# Patient Record
Sex: Male | Born: 1965 | Race: White | Hispanic: No | Marital: Married | Smoking: Never smoker
Health system: Southern US, Community
[De-identification: ages and names within clinical notes are randomized; demographics above are authoritative.]

## PROBLEM LIST (undated history)

## (undated) DIAGNOSIS — Z973 Presence of spectacles and contact lenses: Secondary | ICD-10-CM

## (undated) DIAGNOSIS — Z789 Other specified health status: Secondary | ICD-10-CM

## (undated) DIAGNOSIS — T7840XA Allergy, unspecified, initial encounter: Secondary | ICD-10-CM

## (undated) HISTORY — PX: HERNIA REPAIR: SHX51

## (undated) HISTORY — DX: Allergy, unspecified, initial encounter: T78.40XA

## (undated) HISTORY — PX: WISDOM TOOTH EXTRACTION: SHX21

---

## 2002-07-20 ENCOUNTER — Emergency Department (HOSPITAL_COMMUNITY): Admission: EM | Admit: 2002-07-20 | Discharge: 2002-07-21 | Payer: Self-pay | Admitting: Emergency Medicine

## 2002-07-21 ENCOUNTER — Encounter: Payer: Self-pay | Admitting: Emergency Medicine

## 2010-08-18 HISTORY — PX: VASECTOMY: SHX75

## 2011-02-01 ENCOUNTER — Emergency Department (HOSPITAL_BASED_OUTPATIENT_CLINIC_OR_DEPARTMENT_OTHER)
Admission: EM | Admit: 2011-02-01 | Discharge: 2011-02-01 | Disposition: A | Discharge status: Home / Self Care | Payer: No Typology Code available for payment source | Attending: Emergency Medicine | Admitting: Emergency Medicine

## 2011-02-01 DIAGNOSIS — M25519 Pain in unspecified shoulder: Secondary | ICD-10-CM | POA: Insufficient documentation

## 2011-02-01 DIAGNOSIS — Y92410 Unspecified street and highway as the place of occurrence of the external cause: Secondary | ICD-10-CM | POA: Insufficient documentation

## 2012-06-01 ENCOUNTER — Encounter (INDEPENDENT_AMBULATORY_CARE_PROVIDER_SITE_OTHER): Payer: Self-pay

## 2012-06-15 ENCOUNTER — Other Ambulatory Visit (INDEPENDENT_AMBULATORY_CARE_PROVIDER_SITE_OTHER): Payer: Self-pay | Admitting: Surgery

## 2012-06-15 ENCOUNTER — Ambulatory Visit (INDEPENDENT_AMBULATORY_CARE_PROVIDER_SITE_OTHER): Payer: BC Managed Care – PPO | Admitting: Surgery

## 2012-06-15 ENCOUNTER — Encounter (INDEPENDENT_AMBULATORY_CARE_PROVIDER_SITE_OTHER): Payer: Self-pay | Admitting: Surgery

## 2012-06-15 VITALS — BP 126/82 | HR 95 | Temp 97.8°F | Ht 70.5 in | Wt 226.8 lb

## 2012-06-15 DIAGNOSIS — K429 Umbilical hernia without obstruction or gangrene: Secondary | ICD-10-CM

## 2012-06-15 NOTE — Patient Instructions (Signed)
Hernia A hernia occurs when an internal organ pushes out through a weak spot in the abdominal wall. Hernias most commonly occur in the groin and around the navel. Hernias often can be pushed back into place (reduced). Most hernias tend to get worse over time. Some abdominal hernias can get stuck in the opening (irreducible or incarcerated hernia) and cannot be reduced. An irreducible abdominal hernia which is tightly squeezed into the opening is at risk for impaired blood supply (strangulated hernia). A strangulated hernia is a medical emergency. Because of the risk for an irreducible or strangulated hernia, surgery may be recommended to repair a hernia. CAUSES   Heavy lifting.  Prolonged coughing.  Straining to have a bowel movement.  A cut (incision) made during an abdominal surgery. HOME CARE INSTRUCTIONS   Bed rest is not required. You may continue your normal activities.  Avoid lifting more than 10 pounds (4.5 kg) or straining.  Cough gently. If you are a smoker it is best to stop. Even the best hernia repair can break down with the continual strain of coughing. Even if you do not have your hernia repaired, a cough will continue to aggravate the problem.  Do not wear anything tight over your hernia. Do not try to keep it in with an outside bandage or truss. These can damage abdominal contents if they are trapped within the hernia sac.  Eat a normal diet.  Avoid constipation. Straining over long periods of time will increase hernia size and encourage breakdown of repairs. If you cannot do this with diet alone, stool softeners may be used. SEEK IMMEDIATE MEDICAL CARE IF:   You have a fever.  You develop increasing abdominal pain.  You feel nauseous or vomit.  Your hernia is stuck outside the abdomen, looks discolored, feels hard, or is tender.  You have any changes in your bowel habits or in the hernia that are unusual for you.  You have increased pain or swelling around the  hernia.  You cannot push the hernia back in place by applying gentle pressure while lying down. MAKE SURE YOU:   Understand these instructions.  Will watch your condition.  Will get help right away if you are not doing well or get worse. Document Released: 08/04/2005 Document Revised: 10/27/2011 Document Reviewed: 03/23/2008 [facility] Patient Information 2013 [place], [place].   Hernia, Surgical Repair Care After Refer to this sheet in the next few weeks. These discharge instructions provide you with general information on caring for yourself after you leave the hospital. Your caregiver may also give you specific instructions. Your treatment has been planned according to the most current medical practices available, but unavoidable complications sometimes occur. If you have any problems or questions after discharge, please call your caregiver. HOME CARE INSTRUCTIONS   It is normal to be sore for a couple weeks after surgery. See your caregiver if this seems to be getting worse rather than better.  Put ice on the operative site.  Put ice in a plastic bag.  Place a towel between your skin and the bag.  Leave the ice on for 15 to 20 minutes at a time, 3 to 4 times a day for the first 2 days.  Change bandages (dressings) as directed.  Keep the wound dry and clean. The wound may be washed gently with soap and water. Gently blot or dab the wound dry. Do not take baths, use swimming pools, or use hot tubs for 10 days, or as directed by your caregiver.  Only take over-the-counter or prescription medicines for pain, discomfort, or fever as directed by your caregiver.  Continue your normal diet as directed.  Do not drive until your caregiver says it is okay.  Do not lift anything more than 10 pounds or play contact sports for 3 weeks, or as directed.  Make an appointment to see your caregiver for stitches (sutures) or staple removal when instructed. SEEK MEDICAL CARE IF:   You have  increased bleeding coming from the wounds.  You have blood in your stool.  You see redness, swelling, or have increasing pain in the wounds.  You have fluid (pus) coming from the wound.  You have an oral temperature above 102 F (38.9 C).  You notice a bad smell coming from the wound or dressing.  You develop lightheadedness or feel faint. SEEK IMMEDIATE MEDICAL CARE IF:   You develop a rash.  You have difficulty breathing.  You develop any reaction or side effects to medicines given. MAKE SURE YOU:   Understand these instructions.  Will watch your condition.  Will get help right away if you are not doing well or get worse. Document Released: 02/21/2005 Document Revised: 10/27/2011 Document Reviewed: 07/04/2009 [facility] Patient Information 2013 [place], [place].

## 2012-06-15 NOTE — Progress Notes (Signed)
Patient ID: Erik Lloyd, male   DOB: [birth year], 46 y.o.   MRN: [id number]  Chief Complaint  Patient presents with  . Umbilical Hernia    pre op    HPI Erik Lloyd is a 46 y.o. male.   HPI patient sent at the request of Dr. Penni Bombard do to bulging umbilicus. This has been present for 6 months. Is causing mild discomfort especially when coughing, laughing or sneezing. No nausea or vomiting.  No sharp stabbing pain.  History reviewed. No pertinent past medical history.  Past Surgical History  Procedure Date  . Vasectomy 2012    Family History  Problem Relation Age of Onset  . Hypertension Mother   . Thyroid disease Mother   . Hypertension Father     Social History History  Substance Use Topics  . Smoking status: Never Smoker   . Smokeless tobacco: Not on file  . Alcohol Use: No    No Known Allergies  No current outpatient prescriptions on file.    Review of Systems Review of Systems  Constitutional: Negative for fever, chills and unexpected weight change.  HENT: Negative for hearing loss, congestion, sore throat, trouble swallowing and voice change.   Eyes: Negative for visual disturbance.  Respiratory: Negative for cough and wheezing.   Cardiovascular: Negative for chest pain, palpitations and leg swelling.  Gastrointestinal: Negative for nausea, vomiting, abdominal pain, diarrhea, constipation, blood in stool, abdominal distention, anal bleeding and rectal pain.  Genitourinary: Negative for hematuria and difficulty urinating.  Musculoskeletal: Negative for arthralgias.  Skin: Negative for rash and wound.  Neurological: Negative for seizures, syncope, weakness and headaches.  Hematological: Negative for adenopathy. Does not bruise/bleed easily.  Psychiatric/Behavioral: Negative for confusion.    Blood pressure 126/82, pulse 95, temperature 97.8 F (36.6 C), temperature source Temporal, height 5' 10.5" (1.791 m), weight 226 lb 12.8 oz (102.876 kg), SpO2  96.00%.  Physical Exam Physical Exam  Constitutional: He appears well-developed and well-nourished.  HENT:  Head: Normocephalic and atraumatic.  Eyes: EOM are normal. Pupils are equal, round, and reactive to light.  Neck: Normal range of motion. Neck supple.  Cardiovascular: Normal rate and regular rhythm.   Pulmonary/Chest: Effort normal and breath sounds normal.  Abdominal: Soft. Normal appearance. He exhibits no distension. A hernia is present.    Musculoskeletal: Normal range of motion.  Neurological: He is alert.  Skin: Skin is warm and dry.  Psychiatric: He has a normal mood and affect. His behavior is normal. Judgment normal.      Assessment    Umbilical hernia symptomatic    Plan    Repair umbilical hernia. Patient wishes to have his hernia repaired since it is getting larger becoming more uncomfortable.  The risk of hernia repair include bleeding,  Infection,   Recurrence of the hernia,  Mesh use, chronic pain,  Organ injury,  Bowel injury,  Bladder injury,   nerve injury with numbness around the incision,  Death,  and worsening of preexisting  medical problems.  The alternatives to surgery have been discussed as well..  Long term expectations of both operative and non operative treatments have been discussed.   The patient agrees to proceed.       Erik Lloyd A. 06/15/2012, 10:08 AM

## 2012-09-08 ENCOUNTER — Encounter (HOSPITAL_BASED_OUTPATIENT_CLINIC_OR_DEPARTMENT_OTHER): Payer: Self-pay | Admitting: *Deleted

## 2012-09-08 NOTE — Progress Notes (Signed)
No med Never smoked occ snores, no sleep apnea To come in for [facility] labs-

## 2012-09-10 ENCOUNTER — Encounter (HOSPITAL_BASED_OUTPATIENT_CLINIC_OR_DEPARTMENT_OTHER): Payer: BC Managed Care – PPO

## 2012-09-10 LAB — CBC WITH DIFFERENTIAL/PLATELET
Basophils Relative: 1 % (ref 0–1)
Eosinophils Absolute: 0.2 10*3/uL (ref 0.0–0.7)
Eosinophils Relative: 2 % (ref 0–5)
Lymphs Abs: 2.1 10*3/uL (ref 0.7–4.0)
MCH: 30.8 pg (ref 26.0–34.0)
MCHC: 35.6 g/dL (ref 30.0–36.0)
MCV: 86.7 fL (ref 78.0–100.0)
Platelets: 294 10*3/uL (ref 150–400)
RBC: 5.48 MIL/uL (ref 4.22–5.81)
RDW: 13.3 % (ref 11.5–15.5)

## 2012-09-10 LAB — COMPREHENSIVE METABOLIC PANEL
ALT: 32 U/L (ref 0–53)
Albumin: 4 g/dL (ref 3.5–5.2)
BUN: 14 mg/dL (ref 6–23)
Calcium: 9.1 mg/dL (ref 8.4–10.5)
GFR calc Af Amer: >90 mL/min (ref >90)
Glucose, Bld: 103 mg/dL — ABNORMAL HIGH (ref 70–99)
Sodium: 138 mEq/L (ref 135–145)
Total Protein: 7 g/dL (ref 6.0–8.3)

## 2012-09-14 ENCOUNTER — Ambulatory Visit (HOSPITAL_BASED_OUTPATIENT_CLINIC_OR_DEPARTMENT_OTHER): Payer: BC Managed Care – PPO | Admitting: *Deleted

## 2012-09-14 ENCOUNTER — Encounter (HOSPITAL_BASED_OUTPATIENT_CLINIC_OR_DEPARTMENT_OTHER): Payer: Self-pay | Admitting: *Deleted

## 2012-09-14 ENCOUNTER — Ambulatory Visit (HOSPITAL_BASED_OUTPATIENT_CLINIC_OR_DEPARTMENT_OTHER)
Admission: RE | Admit: 2012-09-14 | Discharge: 2012-09-14 | Disposition: A | Discharge status: Home / Self Care | Payer: BC Managed Care – PPO | Source: Ambulatory Visit | Attending: Surgery | Admitting: Surgery

## 2012-09-14 ENCOUNTER — Encounter (HOSPITAL_BASED_OUTPATIENT_CLINIC_OR_DEPARTMENT_OTHER): Payer: Self-pay

## 2012-09-14 ENCOUNTER — Encounter (HOSPITAL_BASED_OUTPATIENT_CLINIC_OR_DEPARTMENT_OTHER)
Admission: RE | Disposition: A | Discharge status: Home / Self Care | Payer: Self-pay | Source: Ambulatory Visit | Attending: Surgery

## 2012-09-14 DIAGNOSIS — K429 Umbilical hernia without obstruction or gangrene: Secondary | ICD-10-CM

## 2012-09-14 HISTORY — PX: INSERTION OF MESH: SHX5868

## 2012-09-14 HISTORY — PX: UMBILICAL HERNIA REPAIR: SHX196

## 2012-09-14 HISTORY — DX: Other specified health status: Z78.9

## 2012-09-14 HISTORY — DX: Presence of spectacles and contact lenses: Z97.3

## 2012-09-14 LAB — POCT HEMOGLOBIN-HEMACUE: Hemoglobin: 16.7 g/dL (ref 13.0–17.0)

## 2012-09-14 SURGERY — REPAIR, HERNIA, UMBILICAL, ADULT
Anesthesia: General | Site: Abdomen | Wound class: Clean

## 2012-09-14 MED ORDER — OXYCODONE-ACETAMINOPHEN 5-325 MG PO TABS
1.0000 | ORAL_TABLET | ORAL | Status: DC | PRN
Start: 2012-09-14 — End: 2014-11-13

## 2012-09-14 MED ORDER — OXYCODONE HCL 5 MG/5ML PO SOLN
5.0000 mg | Freq: Once | ORAL | Status: AC | PRN
Start: 2012-09-14 — End: 2012-09-14

## 2012-09-14 MED ORDER — BUPIVACAINE-EPINEPHRINE 0.25% -1:200000 IJ SOLN
INTRAMUSCULAR | Status: DC | PRN
  Administered 2012-09-14: 20 mL

## 2012-09-14 MED ORDER — CEFAZOLIN SODIUM-DEXTROSE 2-3 GM-% IV SOLR
INTRAVENOUS | Status: DC | PRN
  Administered 2012-09-14: 2 g via INTRAVENOUS

## 2012-09-14 MED ORDER — FENTANYL CITRATE 0.05 MG/ML IJ SOLN
INTRAMUSCULAR | Status: DC | PRN
  Administered 2012-09-14 (×2): 50 ug via INTRAVENOUS

## 2012-09-14 MED ORDER — OXYCODONE HCL 5 MG PO TABS
5.0000 mg | ORAL_TABLET | Freq: Once | ORAL | Status: AC | PRN
  Administered 2012-09-14: 5 mg via ORAL

## 2012-09-14 MED ORDER — MIDAZOLAM HCL 5 MG/5ML IJ SOLN
INTRAMUSCULAR | Status: DC | PRN
  Administered 2012-09-14: 2 mg via INTRAVENOUS

## 2012-09-14 MED ORDER — 0.9 % SODIUM CHLORIDE (POUR BTL) OPTIME
TOPICAL | Status: DC | PRN
  Administered 2012-09-14: 100 mL

## 2012-09-14 MED ORDER — DEXAMETHASONE SODIUM PHOSPHATE 4 MG/ML IJ SOLN
INTRAMUSCULAR | Status: DC | PRN
  Administered 2012-09-14: 10 mg via INTRAVENOUS

## 2012-09-14 MED ORDER — ONDANSETRON HCL 4 MG/2ML IJ SOLN
INTRAMUSCULAR | Status: DC | PRN
  Administered 2012-09-14: 4 mg via INTRAVENOUS

## 2012-09-14 MED ORDER — PROPOFOL 10 MG/ML IV BOLUS
INTRAVENOUS | Status: DC | PRN
  Administered 2012-09-14: 250 mg via INTRAVENOUS

## 2012-09-14 MED ORDER — FENTANYL CITRATE 0.05 MG/ML IJ SOLN: 50.0000 ug | INTRAMUSCULAR | Status: DC | PRN

## 2012-09-14 MED ORDER — LACTATED RINGERS IV SOLN
INTRAVENOUS | Status: DC
  Administered 2012-09-14 (×2): via INTRAVENOUS

## 2012-09-14 MED ORDER — HYDROMORPHONE HCL PF 1 MG/ML IJ SOLN
0.2500 mg | INTRAMUSCULAR | Status: DC | PRN
  Administered 2012-09-14 (×3): 0.5 mg via INTRAVENOUS

## 2012-09-14 MED ORDER — ONDANSETRON HCL 4 MG/2ML IJ SOLN: 4.0000 mg | Freq: Once | INTRAMUSCULAR | Status: DC | PRN

## 2012-09-14 MED ORDER — TRAMADOL HCL 50 MG PO TABS: 50.0000 mg | ORAL_TABLET | Freq: Four times a day (QID) | ORAL | Status: DC | PRN

## 2012-09-14 MED ORDER — LIDOCAINE HCL (CARDIAC) 20 MG/ML IV SOLN
INTRAVENOUS | Status: DC | PRN
  Administered 2012-09-14: 60 mg via INTRAVENOUS

## 2012-09-14 MED ORDER — ACETAMINOPHEN 10 MG/ML IV SOLN
1000.0000 mg | Freq: Once | INTRAVENOUS | Status: AC
  Administered 2012-09-14: 1000 mg via INTRAVENOUS

## 2012-09-14 MED ORDER — MIDAZOLAM HCL 2 MG/2ML IJ SOLN: 1.0000 mg | INTRAMUSCULAR | Status: DC | PRN

## 2012-09-14 SURGICAL SUPPLY — 54 items
BENZOIN TINCTURE PRP APPL 2/3 (GAUZE/BANDAGES/DRESSINGS) IMPLANT
BLADE SURG 10 STRL SS (BLADE) ×2 IMPLANT
BLADE SURG 15 STRL LF DISP TIS (BLADE) ×1 IMPLANT
BLADE SURG 15 STRL SS (BLADE) ×1
BLADE SURG ROTATE 9660 (MISCELLANEOUS) IMPLANT
CANISTER SUCTION 1200CC (MISCELLANEOUS) ×2 IMPLANT
CHLORAPREP W/TINT 26ML (MISCELLANEOUS) ×2 IMPLANT
CLEANER CAUTERY TIP 5X5 PAD (MISCELLANEOUS) ×1 IMPLANT
CLOTH BEACON ORANGE TIMEOUT ST (SAFETY) ×2 IMPLANT
COVER MAYO STAND STRL (DRAPES) ×2 IMPLANT
COVER TABLE BACK 60X90 (DRAPES) ×2 IMPLANT
DECANTER SPIKE VIAL GLASS SM (MISCELLANEOUS) IMPLANT
DERMABOND ADVANCED (GAUZE/BANDAGES/DRESSINGS) ×2
DERMABOND ADVANCED .7 DNX12 (GAUZE/BANDAGES/DRESSINGS) ×2 IMPLANT
DRAPE LAPAROTOMY TRNSV 102X78 (DRAPE) ×2 IMPLANT
DRAPE UTILITY XL STRL (DRAPES) ×2 IMPLANT
DRSG TEGADERM 4X4.75 (GAUZE/BANDAGES/DRESSINGS) IMPLANT
ELECT REM PT RETURN 9FT ADLT (ELECTROSURGICAL) ×2
ELECTRODE REM PT RTRN 9FT ADLT (ELECTROSURGICAL) ×1 IMPLANT
GLOVE BIO SURGEON STRL SZ 6.5 (GLOVE) ×2 IMPLANT
GLOVE BIOGEL PI IND STRL 7.0 (GLOVE) ×1 IMPLANT
GLOVE BIOGEL PI IND STRL 8 (GLOVE) ×1 IMPLANT
GLOVE BIOGEL PI INDICATOR 7.0 (GLOVE) ×1
GLOVE BIOGEL PI INDICATOR 8 (GLOVE) ×1
GLOVE ECLIPSE 8.0 STRL XLNG CF (GLOVE) ×2 IMPLANT
GLOVE SKINSENSE NS SZ6.5 (GLOVE) ×1
GLOVE SKINSENSE STRL SZ6.5 (GLOVE) ×1 IMPLANT
GOWN PREVENTION PLUS XLARGE (GOWN DISPOSABLE) ×6 IMPLANT
NEEDLE HYPO 22GX1.5 SAFETY (NEEDLE) IMPLANT
NEEDLE HYPO 25X1 1.5 SAFETY (NEEDLE) ×2 IMPLANT
NS IRRIG 1000ML POUR BTL (IV SOLUTION) IMPLANT
PACK BASIN DAY SURGERY FS (CUSTOM PROCEDURE TRAY) ×2 IMPLANT
PAD CLEANER CAUTERY TIP 5X5 (MISCELLANEOUS) ×1
PATCH VENTRAL MEDIUM 6.4 (Mesh Specialty) ×2 IMPLANT
PENCIL BUTTON HOLSTER BLD 10FT (ELECTRODE) ×2 IMPLANT
SLEEVE SCD COMPRESS KNEE MED (MISCELLANEOUS) ×2 IMPLANT
STAPLER VISISTAT 35W (STAPLE) IMPLANT
STRIP CLOSURE SKIN 1/2X4 (GAUZE/BANDAGES/DRESSINGS) IMPLANT
SUT MNCRL AB 3-0 PS2 18 (SUTURE) ×2 IMPLANT
SUT MON AB 4-0 PC3 18 (SUTURE) ×2 IMPLANT
SUT NOVA NAB DX-16 0-1 5-0 T12 (SUTURE) ×2 IMPLANT
SUT NOVA NAB GS-22 2 0 T19 (SUTURE) IMPLANT
SUT PROLENE 0 CT 1 30 (SUTURE) IMPLANT
SUT SILK 3 0 SH 30 (SUTURE) IMPLANT
SUT VIC AB 2-0 SH 27 (SUTURE) ×3
SUT VIC AB 2-0 SH 27XBRD (SUTURE) ×3 IMPLANT
SUT VIC AB 3-0 SH 27 (SUTURE)
SUT VIC AB 3-0 SH 27X BRD (SUTURE) IMPLANT
SYR CONTROL 10ML LL (SYRINGE) ×2 IMPLANT
TOWEL OR 17X24 6PK STRL BLUE (TOWEL DISPOSABLE) ×2 IMPLANT
TOWEL OR NON WOVEN STRL DISP B (DISPOSABLE) ×2 IMPLANT
TUBE CONNECTING 20X1/4 (TUBING) ×2 IMPLANT
WATER STERILE IRR 1000ML POUR (IV SOLUTION) ×2 IMPLANT
YANKAUER SUCT BULB TIP NO VENT (SUCTIONS) ×2 IMPLANT

## 2012-09-14 NOTE — Transfer of Care (Signed)
Immediate Anesthesia Transfer of Care Note  Patient: Erik Lloyd  Procedure(s) Performed: Procedure(s) (LRB) with comments: HERNIA REPAIR UMBILICAL ADULT (N/A) - umbilical hernia repair with mesh INSERTION OF MESH (N/A)  Patient Location: PACU  Anesthesia Type:General  Level of Consciousness: awake and confused  Airway & Oxygen Therapy: Patient Spontanous Breathing and Patient connected to face mask oxygen  Post-op Assessment: Report given to PACU RN, Post -op Vital signs reviewed and stable and Patient moving all extremities  Post vital signs: Reviewed and stable  Complications: No apparent anesthesia complications

## 2012-09-14 NOTE — Anesthesia Postprocedure Evaluation (Signed)
  Anesthesia Post-op Note  Patient: Erik Lloyd  Procedure(s) Performed: Procedure(s) (LRB) with comments: HERNIA REPAIR UMBILICAL ADULT (N/A) - umbilical hernia repair with mesh INSERTION OF MESH (N/A)  Patient Location: PACU  Anesthesia Type:General  Level of Consciousness: awake, alert  and oriented  Airway and Oxygen Therapy: Patient Spontanous Breathing and Patient connected to face mask oxygen  Post-op Pain: mild  Post-op Assessment: Post-op Vital signs reviewed  Post-op Vital Signs: Reviewed  Complications: No apparent anesthesia complications

## 2012-09-14 NOTE — Op Note (Signed)
NAME:  Erik Lloyd, Erik Lloyd NO.:  [id number]  MEDICAL RECORD NO.:  [id number]  LOCATION:                                 FACILITY:  PHYSICIAN:  Maisie Fus A. Adrean Heitz, M.D.DATE OF BIRTH:  09-Dec-1965  DATE OF PROCEDURE:  09/14/2012 DATE OF DISCHARGE:                              OPERATIVE REPORT   PREOPERATIVE DIAGNOSIS:  Umbilical hernia, measuring 2 x 2 cm.  POSTOPERATIVE DIAGNOSIS:  Umbilical hernia, measuring 2 x 2 cm.  PROCEDURE:  Repair of umbilical hernia with 6.4 cm x 6.4 cm Proceed umbilical patch mesh.  SURGEON:  Maisie Fus A. Arianna Delsanto, MD  ANESTHESIA:  LMA with 0.25% Sensorcaine local.  EBL:  Minimal.  SPECIMENS:  None.  DRAINS:  None.  INDICATIONS FOR PROCEDURE:  The patient has a symptomatic umbilical hernia.  He wishes repair due to pain and discomfort and enlarging size. Risks, benefits, and alternative therapies discussed.  Risk of bleeding, infection, small-bowel obstruction, mesh infection, mesh erosion in the bowel, mesh disruption, need for further operations, discussed. Alternatives discussed.  He wished to proceed.  DESCRIPTION OF PROCEDURE:  The patient was seen in the holding area and questions were answered.  He was taken back to the operating room where he was placed supine on the operating room table.  After induction of general endotracheal anesthesia, the abdomen was prepped and draped in sterile fashion.  He received 2 g of Ancef.  Time-out was done.  A 0.25% Sensorcaine was infiltrated around the base of the umbilicus. Curvilinear incision was made around the base of the umbilicus and dissection was carried down.  The hernia sac was opened and we entered the preperitoneal space and subsequently intra-abdominal cavity.  The defect, there is some omentum up in this and I dissected it off the undersurface of the umbilicus and reduced it back into the abdominal cavity.  There was no bowel.  I was able to then use Kocher to grab  the fascial edges and the total defect measured 2 x 2 cm.  I mobilized the fascia to have a nice fascial edge to sew to.  A 6.4 cm x 6.4 cm Proceed patch was used.  This was placed under the fascia.  It was secured circumferentially using 8 sutures of #1 Novafil pop offs.  There is no gaps that things to be caught up under the mesh and I examined it.  I then closed the fascia over this with #1 Novafil pop offs.  I reattached the umbilicus to the fascia using 2-0 Vicryl.  4-0 Monocryl was used to close the skin in subcuticular fashion.  Dermabond applied.  All final counts found to be correct of sponge, needle, and instruments.  The patient was awoke, extubated, and taken to recovery in satisfactory condition.     Peng Thorstenson A. Augie Vane, M.D.     TAC/MEDQ  D:  09/14/2012  T:  09/14/2012  Job:  [id number]

## 2012-09-14 NOTE — Brief Op Note (Signed)
09/14/2012  8:54 AM  PATIENT:  Erik Lloyd  47 y.o. male  PRE-OPERATIVE DIAGNOSIS:  umbilical hernia  POST-OPERATIVE DIAGNOSIS:  umbilical hernia  PROCEDURE:  Procedure(s) (LRB) with comments: HERNIA REPAIR UMBILICAL ADULT (N/A) - umbilical hernia repair with mesh INSERTION OF MESH (N/A)  SURGEON:  Surgeon(s) and Role:    * Elgar Scoggins A. Chanse Kagel, MD - Primary  PHYSICIAN ASSISTANT:   ASSISTANTS: none   ANESTHESIA:   local and spinal  EBL:  Total I/O In: 1500 [I.V.:1500] Out: -   BLOOD ADMINISTERED:none  DRAINS: none   LOCAL MEDICATIONS USED:  BUPIVICAINE   SPECIMEN:  No Specimen  DISPOSITION OF SPECIMEN:  N/A  COUNTS:  YES  TOURNIQUET:  * No tourniquets in log *  DICTATION: .Other Dictation: Dictation Number [id number]  PLAN OF CARE: Discharge to home after PACU  PATIENT DISPOSITION:  PACU - hemodynamically stable.   Delay start of Pharmacological VTE agent (>24hrs) due to surgical blood loss or risk of bleeding: not applicable

## 2012-09-14 NOTE — H&P (Signed)
Currently admitted as of 09/14/2012  Demographics Erik Lloyd 47 year old male  Comm Pref: None [address]  [place] [address] [phone] Max Fickle (M)  Works at [employer]  Problem ListHospitalization ProblemNon-Hospital  Umbilical hernia  Significant History/Details  Smoking: Never Smoker   Smokeless Tobacco: Unknown  Alcohol: No  No open orders  Language: [place]   Specialty CommentsEditShow AllReportPHI SIGNED FOR [name] (WIFE) DOB [birth year]01[birth year] GM/06-15-12 DOS 07/08/12 TC-CDS-OP- Open umbilical hernia rep w/mesh/ cef 06/15/12 06/17/2012 patient scheduled for op surgery 07/08/2012 @ [facility] no precert required. (cef,chm) DOS 07/08/12 cancelled per pt, rescheduled to 09/14/12, change sheet out, cef DOS 09/14/12 TC-CDS-OP- Open umbilical hernia rep w/mesh/ cef 06/30/12 06/30/2012 patient surgery originally scheduled for 07/08/2012 has been rescheduled for a new date of 09/14/2012 per change sheet rec'd [name] and no precert require. (chm)    MedicationsFacility-Administered Medications Show prescriptions    No current Facility-Administered Medications  Relevant Labs (3 years)  Na K Cl C02 WBC Hgb Hct Plts  09/10/12 1530 -- -- -- -- 7.4 16.9 47.5 294  09/10/12 1530 138 4.4 100 -- -- -- -- --                  Relevant Encounters (Maximum of 10 visits)Date Type Department Provider Description  09/14/2012 Surgery [facility] Alishia Lebo A., MD   06/15/2012 Orders Only [facility] Harriette Bouillon A., MD   06/15/2012 Office Visit [facility], [place] Dortha Schwalbe., MD Umbilical Hernia (Primary Dx)  06/01/2012 [facility] Brennan Bailey, [place]             Patient ID: Erik Lloyd, male   DOB: [birth year], 47 y.o.   MRN: [id number]    Chief Complaint   Patient presents with   .  Umbilical Hernia       pre op      HPI Erik Lloyd is a 47 y.o. male.   HPI patient sent  at the request of Dr. Penni Bombard do to bulging umbilicus. This has been present for 6 months. Is causing mild discomfort especially when coughing, laughing or sneezing. No nausea or vomiting.  No sharp stabbing pain.   History reviewed. No pertinent past medical history.    Past Surgical History   Procedure  Date   .  Vasectomy  2012       Family History   Problem  Relation  Age of Onset   .  Hypertension  Mother     .  Thyroid disease  Mother     .  Hypertension  Father        Social History History   Substance Use Topics   .  Smoking status:  Never Smoker    .  Smokeless tobacco:  Not on file   .  Alcohol Use:  No      No Known Allergies    No current outpatient prescriptions on file.      Review of Systems Review of Systems  Constitutional: Negative for fever, chills and unexpected weight change.  HENT: Negative for hearing loss, congestion, sore throat, trouble swallowing and voice change.   Eyes: Negative for visual disturbance.  Respiratory: Negative for cough and wheezing.   Cardiovascular: Negative for chest pain, palpitations and leg swelling.  Gastrointestinal: Negative for nausea, vomiting, abdominal pain, diarrhea, constipation, blood in stool, abdominal distention, anal bleeding and rectal pain.  Genitourinary: Negative for hematuria and difficulty urinating.  Musculoskeletal:  Negative for arthralgias.  Skin: Negative for rash and wound.  Neurological: Negative for seizures, syncope, weakness and headaches.  Hematological: Negative for adenopathy. Does not bruise/bleed easily.  Psychiatric/Behavioral: Negative for confusion.    Blood pressure 126/82, pulse 95, temperature 97.8 F (36.6 C), temperature source Temporal, height 5' 10.5" (1.791 m), weight 226 lb 12.8 oz (102.876 kg), SpO2 96.00%.   Physical Exam Physical Exam  Constitutional: He appears well-developed and well-nourished.  HENT:   Head: Normocephalic and atraumatic.  Eyes: EOM are  normal. Pupils are equal, round, and reactive to light.  Neck: Normal range of motion. Neck supple.  Cardiovascular: Normal rate and regular rhythm.   Pulmonary/Chest: Effort normal and breath sounds normal.  Abdominal: Soft. Normal appearance. He exhibits no distension. A hernia is present.  2 cm umbilical hernia reducible  Musculoskeletal: Normal range of motion.  Neurological: He is alert.  Skin: Skin is warm and dry.  Psychiatric: He has a normal mood and affect. His behavior is normal. Judgment normal.        Assessment   Umbilical hernia symptomatic   Plan Repair umbilical hernia. Patient wishes to have his hernia repaired since it is getting larger becoming more uncomfortable.  The risk of hernia repair include bleeding,  Infection,   Recurrence of the hernia,  Mesh use, chronic pain,  Organ injury,  Bowel injury,  Bladder injury,   nerve injury with numbness around the incision,  Death,  and worsening of preexisting  medical problems.  The alternatives to surgery have been discussed as well..  Long term expectations of both operative and non operative treatments have been discussed.   The patient agrees to proceed.       Cleavon Goldman A. 09/15/2011

## 2012-09-14 NOTE — Anesthesia Procedure Notes (Signed)
Procedure Name: LMA Insertion Date/Time: 09/14/2012 7:38 AM Performed by: Meyer Russel Pre-anesthesia Checklist: Patient identified, Emergency Drugs available, Suction available and Patient being monitored Patient Re-evaluated:Patient Re-evaluated prior to inductionOxygen Delivery Method: Circle System Utilized Preoxygenation: Pre-oxygenation with 100% oxygen Intubation Type: IV induction Ventilation: Mask ventilation without difficulty LMA: LMA inserted LMA Size: 5.0 Number of attempts: 1 Airway Equipment and Method: bite block Placement Confirmation: positive ETCO2 and breath sounds checked- equal and bilateral Tube secured with: Tape Dental Injury: Teeth and Oropharynx as per pre-operative assessment

## 2012-09-14 NOTE — Anesthesia Preprocedure Evaluation (Addendum)

## 2012-09-14 NOTE — Interval H&P Note (Signed)
History and Physical Interval Note:  09/14/2012 7:01 AM  Erik Lloyd  has presented today for surgery, with the diagnosis of umbilical hernia  The various methods of treatment have been discussed with the patient and family. After consideration of risks, benefits and other options for treatment, the patient has consented to  Procedure(s) (LRB) with comments: HERNIA REPAIR UMBILICAL ADULT (N/A) - umbilical hernia repair with mesh INSERTION OF MESH (N/A) as a surgical intervention .  The patient's history has been reviewed, patient examined, no change in status, stable for surgery.  I have reviewed the patient's chart and labs.  Questions were answered to the patient's satisfaction.     Shirlee Whitmire A.

## 2012-09-15 ENCOUNTER — Encounter (HOSPITAL_BASED_OUTPATIENT_CLINIC_OR_DEPARTMENT_OTHER): Payer: Self-pay | Admitting: Surgery

## 2012-09-15 ENCOUNTER — Telehealth (INDEPENDENT_AMBULATORY_CARE_PROVIDER_SITE_OTHER): Payer: Self-pay | Admitting: General Surgery

## 2012-09-15 NOTE — Telephone Encounter (Signed)
Spoke with this patient he has po f/u appt for 10/08/12 @1130  am . He states he is doing well.

## 2012-10-08 ENCOUNTER — Ambulatory Visit (INDEPENDENT_AMBULATORY_CARE_PROVIDER_SITE_OTHER): Payer: BC Managed Care – PPO | Admitting: Surgery

## 2012-10-08 ENCOUNTER — Encounter (INDEPENDENT_AMBULATORY_CARE_PROVIDER_SITE_OTHER): Payer: Self-pay | Admitting: Surgery

## 2012-10-08 VITALS — BP 130/82 | HR 80 | Temp 97.7°F | Resp 18 | Ht 70.0 in | Wt 230.0 lb

## 2012-10-08 DIAGNOSIS — Z9889 Other specified postprocedural states: Secondary | ICD-10-CM

## 2012-10-08 NOTE — Patient Instructions (Signed)
Return to full duty 1 week

## 2012-10-08 NOTE — Progress Notes (Signed)
Pt returns today after umbilical  hernia repair.  Pain is well controlled.  Bowels are functioning.  Wound is clean.  On exam:  Incision is clean /dry/intact.  Area is soft without signs of hernia recurrence.  Impression:  Status repair of hernia umbilical with mesh  Plan:  RTC PRN

## 2014-09-12 ENCOUNTER — Encounter: Payer: Self-pay | Admitting: Internal Medicine

## 2014-11-13 ENCOUNTER — Ambulatory Visit (AMBULATORY_SURGERY_CENTER): Payer: Self-pay | Admitting: *Deleted

## 2014-11-13 VITALS — Ht 70.0 in | Wt 237.0 lb

## 2014-11-13 DIAGNOSIS — Z1211 Encounter for screening for malignant neoplasm of colon: Secondary | ICD-10-CM

## 2014-11-13 NOTE — Progress Notes (Signed)
Patient denies any allergies to eggs or soy. Patient denies any problems with anesthesia/sedation. Patient denies any oxygen use at home and does not take any diet/weight loss medications. EMMI education assisgned to patient on colonoscopy, this was explained and instructions given to patient. 

## 2014-11-27 ENCOUNTER — Encounter: Payer: Self-pay | Admitting: Internal Medicine

## 2014-11-27 ENCOUNTER — Ambulatory Visit (AMBULATORY_SURGERY_CENTER): Payer: BLUE CROSS/BLUE SHIELD | Admitting: Internal Medicine

## 2014-11-27 VITALS — BP 134/87 | HR 72 | Temp 96.3°F | Resp 14 | Ht 70.0 in | Wt 237.0 lb

## 2014-11-27 DIAGNOSIS — Z1211 Encounter for screening for malignant neoplasm of colon: Secondary | ICD-10-CM | POA: Diagnosis present

## 2014-11-27 MED ORDER — SODIUM CHLORIDE 0.9 % IV SOLN: 500.0000 mL | INTRAVENOUS | Status: DC

## 2014-11-27 NOTE — Op Note (Signed)
[facility] [address] [place], [id number]   COLONOSCOPY PROCEDURE REPORT  PATIENT: Erik CocoStewart, Erik  MR#: [id number] BIRTHDATE: 1966-08-04 , 49  yrs. old GENDER: male ENDOSCOPIST: Iva Booparl E Deoni Cosey, MD, [facility] PROCEDURE DATE:  11/27/2014 PROCEDURE:   Colonoscopy, screening First Screening Colonoscopy - Avg.  risk and is 50 yrs.  old or older Yes.  Prior Negative Screening - Now for repeat screening. N/A  History of Adenoma - Now for follow-up colonoscopy & has been > or = to 3 yrs.  N/A ASA CLASS:   Class I INDICATIONS:Screening for colonic neoplasia and Colorectal Neoplasm Risk Assessment for this procedure is average risk. MEDICATIONS: Propofol 250 mg IV  DESCRIPTION OF PROCEDURE:   After the risks benefits and alternatives of the procedure were thoroughly explained, informed consent was obtained.  The digital rectal exam revealed no abnormalities of the rectum, revealed no prostatic nodules, and revealed the prostate was not enlarged.   The LB [id number] endoscope was introduced through the anus and advanced to the cecum, which was identified by both the appendix and ileocecal valve. No adverse events experienced.   The quality of the prep was good.  (MiraLax was used)  The instrument was then slowly withdrawn as the colon was fully examined.      COLON FINDINGS: A normal appearing cecum, ileocecal valve, and appendiceal orifice were identified.  The ascending, transverse, descending, sigmoid colon, and rectum appeared unremarkable. Retroflexed views revealed no abnormalities. The time to cecum = 2.3 Withdrawal time = 10.9   The scope was withdrawn and the procedure completed. COMPLICATIONS: There were no immediate complications.  ENDOSCOPIC IMPRESSION: Normal colonoscopy - good prep - first screening  RECOMMENDATIONS: repeat colonoscopy 10 years 2026  eSigned:  Iva Booparl E Dorla Guizar, MD, [facility] 11/27/2014 12:23 PM   cc: [facility] (Dr. Theadora Ramaichard Keever)

## 2014-11-27 NOTE — Progress Notes (Signed)
Pt awake and alert, pleased with MAC, Report to RN 

## 2014-11-27 NOTE — Patient Instructions (Addendum)
No polyps found!  Next routine colonoscopy in 10 years - 2026  I appreciate the opportunity to care for you. Iva Booparl E. Jerzy Roepke, MD, FACG   YOU HAD AN ENDOSCOPIC PROCEDURE TODAY AT THE [facility]:   Refer to the procedure report that was given to you for any specific questions about what was found during the examination.  If the procedure report does not answer your questions, please call your gastroenterologist to clarify.  If you requested that your care partner not be given the details of your procedure findings, then the procedure report has been included in a sealed envelope for you to review at your convenience later.  YOU SHOULD EXPECT: Some feelings of bloating in the abdomen. Passage of more gas than usual.  Walking can help get rid of the air that was put into your GI tract during the procedure and reduce the bloating. If you had a lower endoscopy (such as a colonoscopy or flexible sigmoidoscopy) you may notice spotting of blood in your stool or on the toilet paper. If you underwent a bowel prep for your procedure, you may not have a normal bowel movement for a few days.  Please Note:  You might notice some irritation and congestion in your nose or some drainage.  This is from the oxygen used during your procedure.  There is no need for concern and it should clear up in a day or so.  SYMPTOMS TO REPORT IMMEDIATELY:   Following lower endoscopy (colonoscopy or flexible sigmoidoscopy):  Excessive amounts of blood in the stool  Significant tenderness or worsening of abdominal pains  Swelling of the abdomen that is new, acute  Fever of 100F or higher   For urgent or emergent issues, a gastroenterologist can be reached at any hour by calling [phone].   DIET: Your first meal following the procedure should be a small meal and then it is ok to progress to your normal diet. Heavy or fried foods are harder to digest and may make you feel nauseous or bloated.   Likewise, meals heavy in dairy and vegetables can increase bloating.  Drink plenty of fluids but you should avoid alcoholic beverages for 24 hours.  ACTIVITY:  You should plan to take it easy for the rest of today and you should NOT DRIVE or use heavy machinery until tomorrow (because of the sedation medicines used during the test).    FOLLOW UP: Our staff will call the number listed on your records the next business day following your procedure to check on you and address any questions or concerns that you may have regarding the information given to you following your procedure. If we do not reach you, we will leave a message.  However, if you are feeling well and you are not experiencing any problems, there is no need to return our call.  We will assume that you have returned to your regular daily activities without incident.  If any biopsies were taken you will be contacted by phone or by letter within the next 1-3 weeks.  Please call us at [phone] if you have not heard about the biopsies in 3 weeks.    SIGNATURES/CONFIDENTIALITY: You and/or your care partner have signed paperwork which will be entered into your electronic medical record.  These signatures attest to the fact that that the information above on your After Visit Summary has been reviewed and is understood.  Full responsibility of the confidentiality of this discharge information lies with  you and/or your care-partner.  Recommendations Discharge instructions given to patient and/or care partner. Next colonoscopy in 10 years.

## 2014-11-28 ENCOUNTER — Telehealth: Payer: Self-pay

## 2014-11-28 NOTE — Telephone Encounter (Signed)
  Follow up Call-  Call back number 11/27/2014  Post procedure Call Back phone  # [phone] cell  Permission to leave phone message Yes     Patient questions:  Do you have a fever, pain , or abdominal swelling? No. Pain Score  0 *  Have you tolerated food without any problems? Yes.    Have you been able to return to your normal activities? Yes.    Do you have any questions about your discharge instructions: Diet   No. Medications  No. Follow up visit  No.  Do you have questions or concerns about your Care? No.  Actions: * If pain score is 4 or above: No action needed, pain <4.

## 2015-01-23 DIAGNOSIS — M25511 Pain in right shoulder: Secondary | ICD-10-CM | POA: Insufficient documentation

## 2017-11-16 ENCOUNTER — Other Ambulatory Visit: Payer: Self-pay | Admitting: Family Medicine

## 2017-11-16 DIAGNOSIS — R0989 Other specified symptoms and signs involving the circulatory and respiratory systems: Secondary | ICD-10-CM

## 2017-11-25 ENCOUNTER — Ambulatory Visit
Admission: RE | Admit: 2017-11-25 | Discharge: 2017-11-25 | Disposition: A | Discharge status: Home / Self Care | Payer: BLUE CROSS/BLUE SHIELD | Source: Ambulatory Visit | Attending: Family Medicine | Admitting: Family Medicine

## 2017-11-25 DIAGNOSIS — R0989 Other specified symptoms and signs involving the circulatory and respiratory systems: Secondary | ICD-10-CM

## 2019-05-19 ENCOUNTER — Ambulatory Visit (INDEPENDENT_AMBULATORY_CARE_PROVIDER_SITE_OTHER): Payer: BC Managed Care – PPO | Admitting: Podiatry

## 2019-05-19 ENCOUNTER — Other Ambulatory Visit: Payer: Self-pay

## 2019-05-19 ENCOUNTER — Encounter: Payer: Self-pay | Admitting: Podiatry

## 2019-05-19 ENCOUNTER — Ambulatory Visit (INDEPENDENT_AMBULATORY_CARE_PROVIDER_SITE_OTHER): Payer: BC Managed Care – PPO

## 2019-05-19 VITALS — BP 145/89 | HR 80 | Resp 16

## 2019-05-19 DIAGNOSIS — M778 Other enthesopathies, not elsewhere classified: Secondary | ICD-10-CM

## 2019-05-19 MED ORDER — METHYLPREDNISOLONE 4 MG PO TBPK: ORAL_TABLET | ORAL | 0 refills | Status: AC

## 2019-05-19 MED ORDER — MELOXICAM 15 MG PO TABS: 15.0000 mg | ORAL_TABLET | Freq: Every day | ORAL | 3 refills | Status: AC

## 2019-05-21 ENCOUNTER — Encounter: Payer: Self-pay | Admitting: Podiatry

## 2019-05-21 NOTE — Progress Notes (Signed)
Subjective:  Patient ID: Erik Lloyd, male    DOB: [birth year],  MRN: [id number] HPI Chief Complaint  Patient presents with  . Foot Pain    2nd and 3rd toes/forefoot right - aching x several months, notice toes splaying, does have numbness down lateral side of leg and foot due to herniated disc in back, wearin crocs which help, injury as well 6 months ago-caught toe when fell  . New Patient (Initial Visit)    53 y.o. male presents with the above complaint.   ROS: Denies fever chills nausea vomiting muscle aches pains calf pain back pain chest pain shortness of breath.  Past Medical History:  Diagnosis Date  . Allergy   . No pertinent past medical history   . Wears glasses    Past Surgical History:  Procedure Laterality Date  . HERNIA REPAIR    . INSERTION OF MESH  09/14/2012   Procedure: INSERTION OF MESH;  Surgeon: Joyice Faster. Cornett, MD;  Location: [place];  Service: General;  Laterality: N/A;  . UMBILICAL HERNIA REPAIR  09/14/2012   Procedure: HERNIA REPAIR UMBILICAL ADULT;  Surgeon: Joyice Faster. Cornett, MD;  Location: [place];  Service: General;  Laterality: N/A;  umbilical hernia repair with mesh  . VASECTOMY  2012  . WISDOM TOOTH EXTRACTION      Current Outpatient Medications:  .  aspirin 81 MG tablet, Take 81 mg by mouth daily., Disp: , Rfl:  .  hydrochlorothiazide (MICROZIDE) 12.5 MG capsule, Take 12.5 mg by mouth daily., Disp: , Rfl:  .  meloxicam (MOBIC) 15 MG tablet, Take 1 tablet (15 mg total) by mouth daily., Disp: 30 tablet, Rfl: 3 .  methylPREDNISolone (MEDROL DOSEPAK) 4 MG TBPK tablet, 6 day dose pack - take as directed, Disp: 21 tablet, Rfl: 0 .  Multiple Vitamins-Minerals (MULTIVITAMIN WITH MINERALS) tablet, Take 1 tablet by mouth daily., Disp: , Rfl:  .  simvastatin (ZOCOR) 20 MG tablet, Take 20 mg by mouth daily., Disp: , Rfl:   No Known Allergies Review of Systems Objective:   Vitals:   05/19/19 1626  BP: (!) 145/89   Pulse: 80  Resp: 16    General: Well developed, nourished, in no acute distress, alert and oriented x3   Dermatological: Skin is warm, dry and supple bilateral. Nails x 10 are well maintained; remaining integument appears unremarkable at this time. There are no open sores, no preulcerative lesions, no rash or signs of infection present.  Vascular: Dorsalis Pedis artery and Posterior Tibial artery pedal pulses are 2/4 bilateral with immedate capillary fill time. Pedal hair growth present. No varicosities and no lower extremity edema present bilateral.   Neruologic: Grossly intact via light touch bilateral. Vibratory intact via tuning fork bilateral. Protective threshold with Semmes Wienstein monofilament intact to all pedal sites bilateral. Patellar and Achilles deep tendon reflexes 2+ bilateral. No Babinski or clonus noted bilateral.   Musculoskeletal: No gross boney pedal deformities bilateral. No pain, crepitus, or limitation noted with foot and ankle range of motion bilateral. Muscular strength 5/5 in all groups tested bilateral.  There is moderate pain on palpation and end range of motion of the second metatarsal phalangeal joint right foot.  There is a mild diastases between the second and third toes there is no hammering at this point.  There is soft tissue swelling around the joint very obvious when compared to the contralateral foot.  Gait: Unassisted, Nonantalgic.    Radiographs:  Elongated second metatarsal with medial deviation  of the second toe with what appears to be dislocation of the joint.  Assessment & Plan:   Assessment: Capsulitis second metatarsal phalangeal joint.  Plan: Discussed etiology pathology conservative versus surgical therapies.  At this point I started him on methylprednisolone to be followed by meloxicam.  I injected around the second metatarsal phalangeal joint with 10 mg of Kenalog 5 mg of Marcaine after sterile Betadine skin prep.  Tolerated procedure  well after Betadine skin prep.  Discussed appropriate shoe gear.     Max T. [place], [place]

## 2019-06-30 ENCOUNTER — Ambulatory Visit: Payer: BC Managed Care – PPO | Admitting: Podiatry

## 2019-08-03 IMAGING — US US CAROTID DUPLEX BILAT
1 series · 13 of 24 positions shown · non-contrast
Comparison: None.

CLINICAL DATA: Right carotid bruit

EXAM:
BILATERAL CAROTID DUPLEX ULTRASOUND
TECHNIQUE: Gray scale imaging, color Doppler and duplex ultrasound were
performed of bilateral carotid and vertebral arteries in the neck.

[Series 1: us carotid duplex bilat · 0.07mm/px · 13 of 70 slices shown]
[im 1/70]
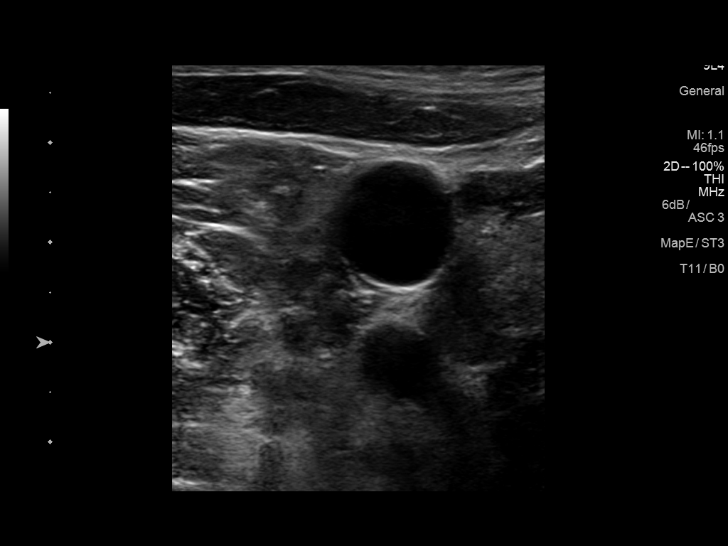
[im 7/70]
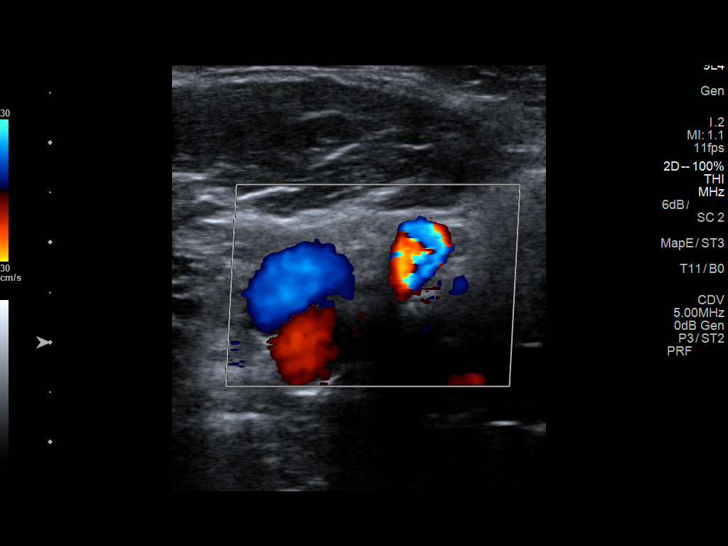
[im 13/70]
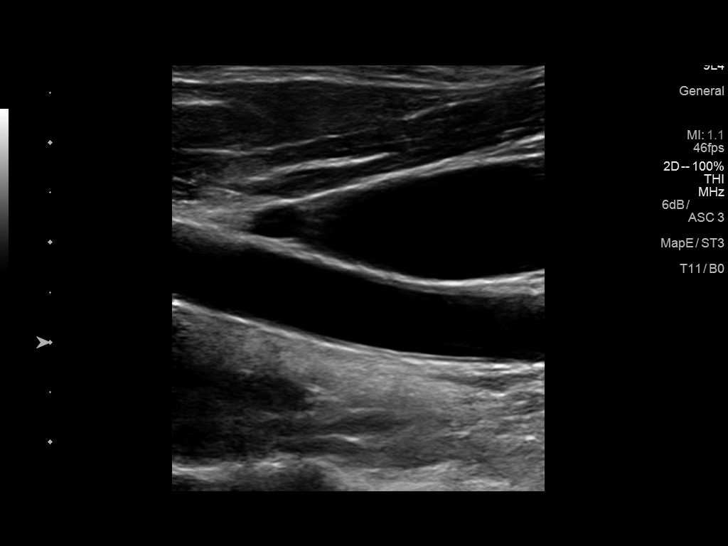
[im 19/70]
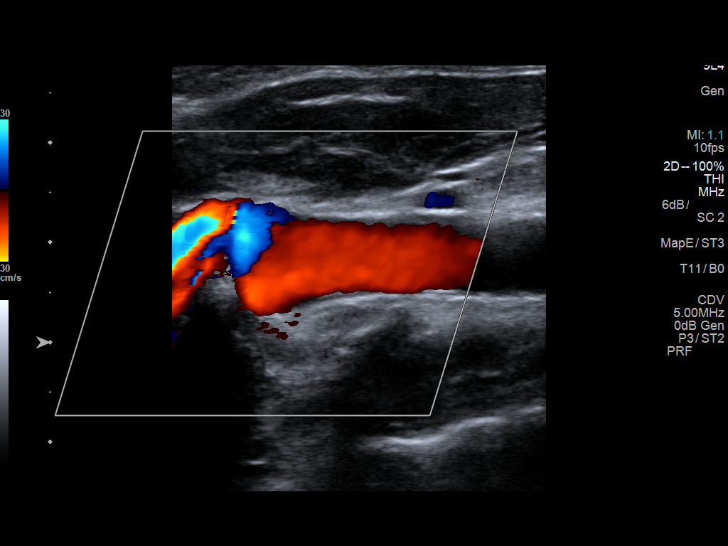
[im 25/70]
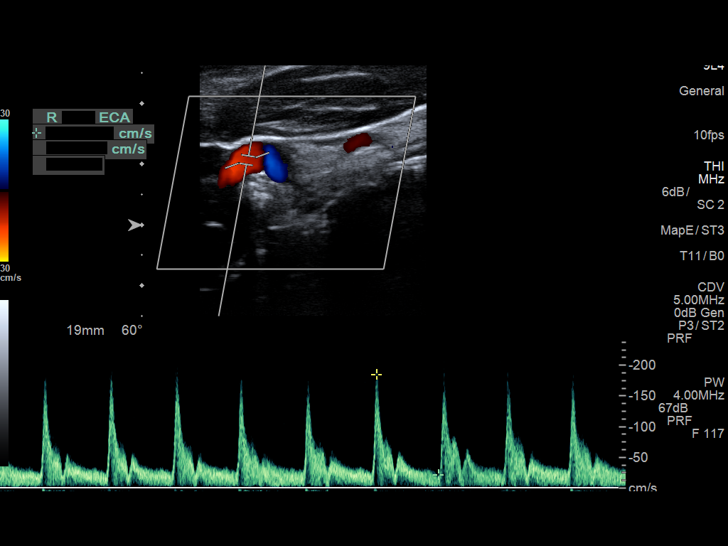
[im 31/70]
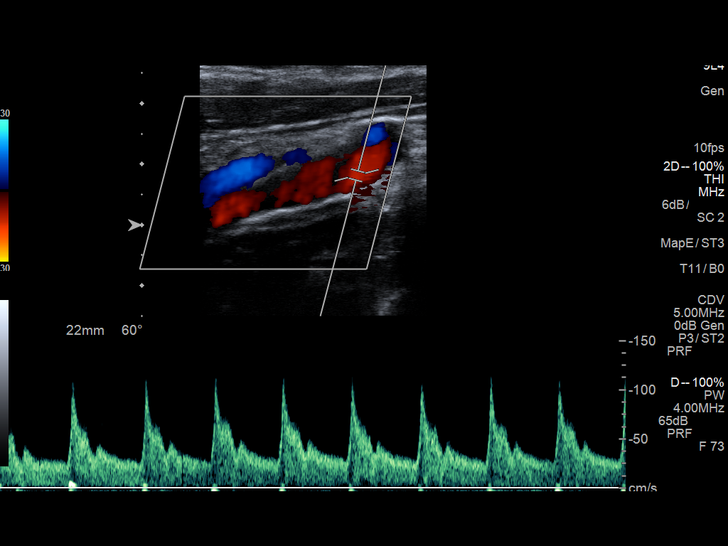
[im 37/70]
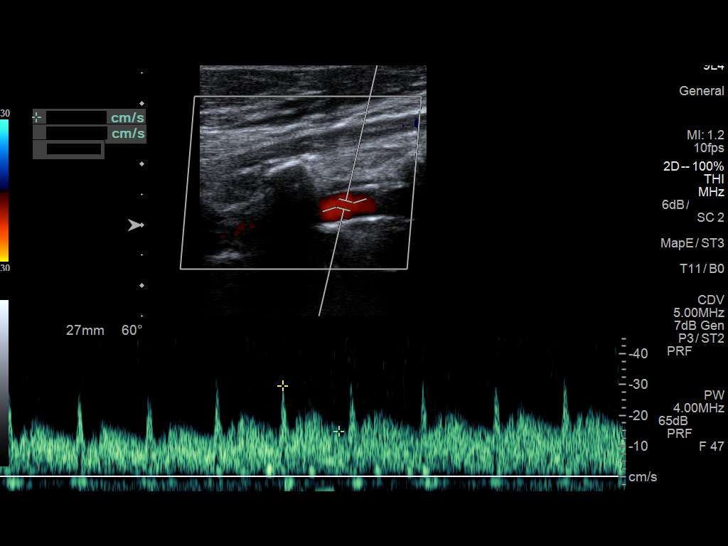
[im 40/70]
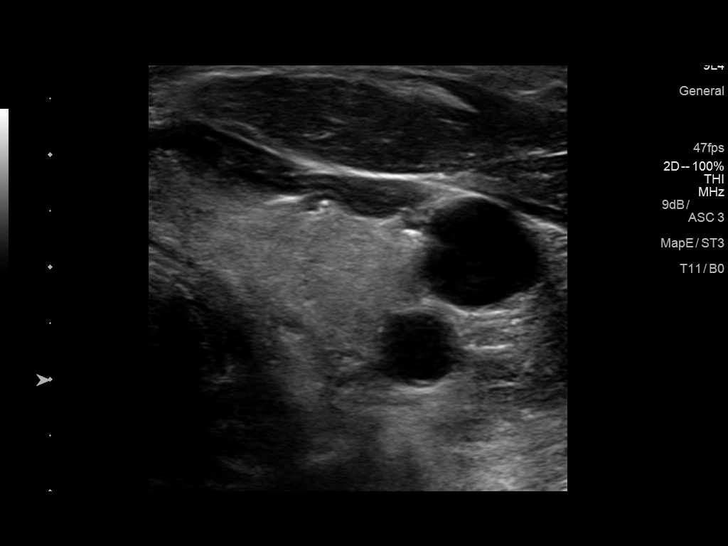
[im 46/70]
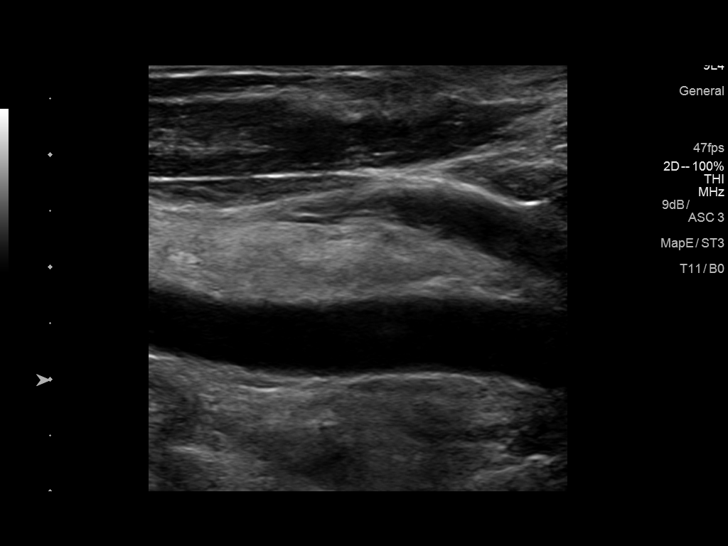
[im 52/70]
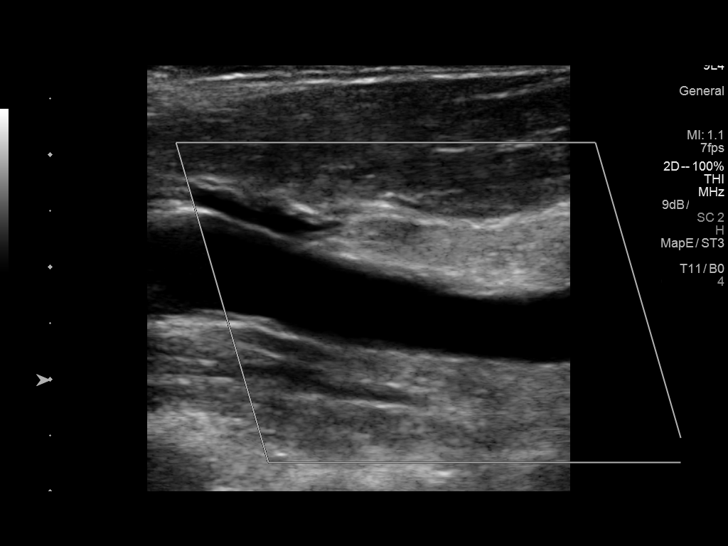
[im 58/70]
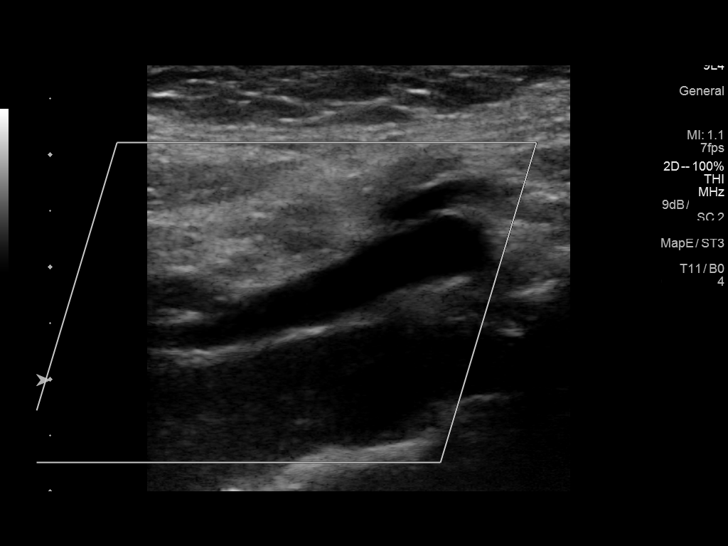
[im 64/70]
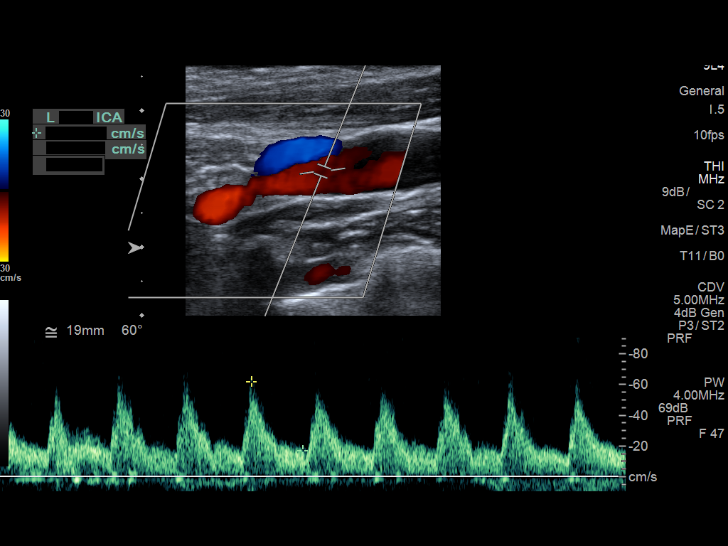
[im 70/70]
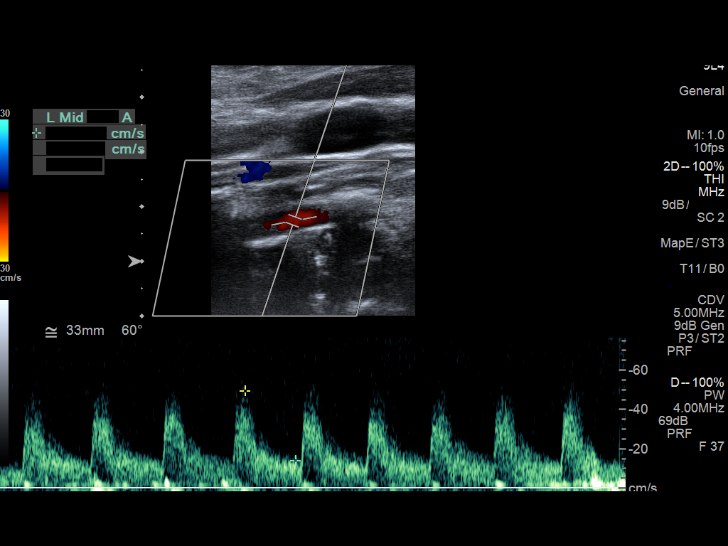

[13 of 24 positions shown; findings below may reference images not displayed]

FINDINGS: Criteria: Quantification of carotid stenosis is based on velocity
parameters that correlate the residual internal carotid diameter
with NASCET-based stenosis levels, using the diameter of the distal
internal carotid lumen as the denominator for stenosis measurement.

The following velocity measurements were obtained:

RIGHT

ICA:  114/30 cm/sec

CCA:  118/27 cm/sec

SYSTOLIC ICA/CCA RATIO:

DIASTOLIC ICA/CCA RATIO:

ECA:  185 cm/sec

LEFT

ICA:  91/35 cm/sec

CCA:  100/18 cm/sec

SYSTOLIC ICA/CCA RATIO:

DIASTOLIC ICA/CCA RATIO:

ECA:  157 cm/sec

RIGHT CAROTID ARTERY: Minor echogenic shadowing plaque formation. No
hemodynamically significant right ICA stenosis, velocity elevation,
or turbulent flow. Degree of narrowing less than 50%.

RIGHT VERTEBRAL ARTERY:  Antegrade

LEFT CAROTID ARTERY: Similar scattered minor echogenic plaque
formation. No hemodynamically significant left ICA stenosis,
velocity elevation, or turbulent flow.

LEFT VERTEBRAL ARTERY:  Antegrade
IMPRESSION: Minor carotid atherosclerosis. No hemodynamically significant ICA
stenosis. Degree of narrowing less than 50% bilaterally by
ultrasound criteria.

Patent antegrade vertebral flow bilaterally

## 2019-11-11 ENCOUNTER — Ambulatory Visit: Payer: Self-pay | Attending: Internal Medicine

## 2019-11-11 DIAGNOSIS — Z23 Encounter for immunization: Secondary | ICD-10-CM

## 2019-11-11 NOTE — Progress Notes (Signed)
   Covid-19 Vaccination Clinic  Name:  Erik Lloyd    MRN: [id number] DOB: 06-18-1966  11/11/2019  Mr. [name] was observed post Covid-19 immunization for 15 minutes without incident. He was provided with Vaccine Information Sheet and instruction to access the V-Safe system.   Mr. [name] was instructed to call 911 with any severe reactions post vaccine: Marland Kitchen Difficulty breathing  . Swelling of face and throat  . A fast heartbeat  . A bad rash all over body  . Dizziness and weakness   Immunizations Administered    Name Date Dose VIS Date Route   Pfizer COVID-19 Vaccine 11/11/2019  4:37 PM 0.3 mL 07/29/2019 Intramuscular   Manufacturer: [employer], [employer]   Lot: [id number]   NDC: [address]-1

## 2019-11-12 ENCOUNTER — Ambulatory Visit: Payer: BLUE CROSS/BLUE SHIELD

## 2019-12-06 ENCOUNTER — Ambulatory Visit: Payer: Self-pay | Attending: Internal Medicine

## 2019-12-06 DIAGNOSIS — Z23 Encounter for immunization: Secondary | ICD-10-CM

## 2019-12-06 NOTE — Progress Notes (Signed)
   Covid-19 Vaccination Clinic  Name:  Erik Lloyd    MRN: [id number] DOB: [birth year]  12/06/2019  [name] was observed post Covid-19 immunization for 15 minutes without incident. He was provided with Vaccine Information Sheet and instruction to access the V-Safe system.   [name] was instructed to call 911 with any severe reactions post vaccine: Marland Kitchen Difficulty breathing  . Swelling of face and throat  . A fast heartbeat  . A bad rash all over body  . Dizziness and weakness   Immunizations Administered    Name Date Dose VIS Date Route   Pfizer COVID-19 Vaccine 12/06/2019  4:30 PM 0.3 mL 10/12/2018 Intramuscular   Manufacturer: [employer], [employer]   Lot: [id number]   NDC: [address]-5

## 2022-11-07 ENCOUNTER — Other Ambulatory Visit: Payer: Self-pay | Admitting: Family Medicine

## 2022-11-07 DIAGNOSIS — R011 Cardiac murmur, unspecified: Secondary | ICD-10-CM

## 2022-11-07 DIAGNOSIS — I2510 Atherosclerotic heart disease of native coronary artery without angina pectoris: Secondary | ICD-10-CM

## 2022-11-07 NOTE — Progress Notes (Signed)
Strong fmhx of ASCVD.  Pt w/ HTN and HLD w/ carotid bruits.  Echo and CAC ordered.

## 2022-12-29 ENCOUNTER — Ambulatory Visit (HOSPITAL_COMMUNITY)
Admission: RE | Admit: 2022-12-29 | Discharge: 2022-12-29 | Disposition: A | Discharge status: Home / Self Care | Payer: 59 | Source: Ambulatory Visit | Attending: Family Medicine | Admitting: Family Medicine

## 2022-12-29 DIAGNOSIS — R011 Cardiac murmur, unspecified: Secondary | ICD-10-CM

## 2022-12-29 DIAGNOSIS — Z8249 Family history of ischemic heart disease and other diseases of the circulatory system: Secondary | ICD-10-CM | POA: Diagnosis not present

## 2022-12-29 LAB — ECHOCARDIOGRAM COMPLETE
Area-P 1/2: 3.27 cm2
S' Lateral: 3.1 cm

## 2022-12-29 NOTE — Progress Notes (Signed)
  Echocardiogram 2D Echocardiogram has been performed.  Erik Lloyd 12/29/2022, 9:32 AM

## 2022-12-31 ENCOUNTER — Other Ambulatory Visit: Payer: Self-pay | Admitting: Family Medicine

## 2022-12-31 DIAGNOSIS — I2510 Atherosclerotic heart disease of native coronary artery without angina pectoris: Secondary | ICD-10-CM

## 2022-12-31 NOTE — Progress Notes (Signed)
Climbing ASCVD risk year over year. Screening CAC ordered.

## 2023-02-04 ENCOUNTER — Other Ambulatory Visit: Payer: 59

## 2023-02-24 ENCOUNTER — Ambulatory Visit (HOSPITAL_BASED_OUTPATIENT_CLINIC_OR_DEPARTMENT_OTHER)
Admission: RE | Admit: 2023-02-24 | Discharge: 2023-02-24 | Disposition: A | Discharge status: Home / Self Care | Payer: 59 | Source: Ambulatory Visit | Attending: Family Medicine | Admitting: Family Medicine

## 2023-02-24 DIAGNOSIS — I2510 Atherosclerotic heart disease of native coronary artery without angina pectoris: Secondary | ICD-10-CM | POA: Insufficient documentation

## 2023-11-17 ENCOUNTER — Other Ambulatory Visit: Payer: Self-pay | Admitting: Family Medicine

## 2023-11-17 DIAGNOSIS — I2510 Atherosclerotic heart disease of native coronary artery without angina pectoris: Secondary | ICD-10-CM

## 2023-11-17 NOTE — Progress Notes (Signed)
 Elevated CAC score 9 mo prior. Pt w/ sx of new general fatigue. No CP, palpitations, SOB.  Repeat CAC w/ likely Cards referral.

## 2023-12-16 ENCOUNTER — Ambulatory Visit (HOSPITAL_BASED_OUTPATIENT_CLINIC_OR_DEPARTMENT_OTHER)
Admission: RE | Admit: 2023-12-16 | Discharge: 2023-12-16 | Disposition: A | Discharge status: Home / Self Care | Payer: Self-pay | Source: Ambulatory Visit | Attending: Family Medicine | Admitting: Family Medicine

## 2023-12-16 DIAGNOSIS — I2510 Atherosclerotic heart disease of native coronary artery without angina pectoris: Secondary | ICD-10-CM | POA: Insufficient documentation
